# Patient Record
Sex: Male | Born: 1985 | Race: Black or African American | Hispanic: No | Marital: Single | Smoking: Light tobacco smoker
Health system: Southern US, Community
[De-identification: ages and names within clinical notes are randomized; demographics above are authoritative.]

---

## 2012-08-14 ENCOUNTER — Emergency Department (HOSPITAL_COMMUNITY)
Admission: EM | Admit: 2012-08-14 | Discharge: 2012-08-14 | Disposition: A | Discharge status: Home / Self Care | Payer: Self-pay | Attending: Emergency Medicine | Admitting: Emergency Medicine

## 2012-08-14 ENCOUNTER — Encounter (HOSPITAL_COMMUNITY): Payer: Self-pay | Admitting: *Deleted

## 2012-08-14 DIAGNOSIS — Z23 Encounter for immunization: Secondary | ICD-10-CM | POA: Insufficient documentation

## 2012-08-14 DIAGNOSIS — S61219A Laceration without foreign body of unspecified finger without damage to nail, initial encounter: Secondary | ICD-10-CM

## 2012-08-14 DIAGNOSIS — S61209A Unspecified open wound of unspecified finger without damage to nail, initial encounter: Secondary | ICD-10-CM | POA: Insufficient documentation

## 2012-08-14 DIAGNOSIS — Y92838 Other recreation area as the place of occurrence of the external cause: Secondary | ICD-10-CM | POA: Insufficient documentation

## 2012-08-14 DIAGNOSIS — F17200 Nicotine dependence, unspecified, uncomplicated: Secondary | ICD-10-CM | POA: Insufficient documentation

## 2012-08-14 DIAGNOSIS — Y9239 Other specified sports and athletic area as the place of occurrence of the external cause: Secondary | ICD-10-CM | POA: Insufficient documentation

## 2012-08-14 DIAGNOSIS — W230XXA Caught, crushed, jammed, or pinched between moving objects, initial encounter: Secondary | ICD-10-CM | POA: Insufficient documentation

## 2012-08-14 DIAGNOSIS — Y9367 Activity, basketball: Secondary | ICD-10-CM | POA: Insufficient documentation

## 2012-08-14 MED ORDER — TETANUS-DIPHTH-ACELL PERTUSSIS 5-2.5-18.5 LF-MCG/0.5 IM SUSP
0.5000 mL | Freq: Once | INTRAMUSCULAR | Status: AC
  Administered 2012-08-14: 0.5 mL via INTRAMUSCULAR
  Filled 2012-08-14: qty 0.5

## 2012-08-14 NOTE — ED Notes (Signed)
Pt has laceration to anterior rt ring finger app 1"long and middle finger app 1/2" long. Bleeding controlled. Pt states he cut himself on the links of a chain in a basketball net.

## 2012-08-14 NOTE — ED Provider Notes (Signed)
History    CSN: [id number] Arrival date & time 08/14/12  4401  First MD Initiated Contact with Patient 08/14/12 2133     Chief Complaint  Patient presents with  . Extremity Laceration   (Consider location/radiation/quality/duration/timing/severity/associated sxs/prior Treatment) HPI Comments: Was pain.  Basketball, when he jumped up to make a talk and caught his right third and fourth finger.  On the basketball, chain, causing a laceration to the palmar aspect of both fingers no active bleeding at this time.  Last tetanus shot is unknown  The history is provided by the patient.   History reviewed. No pertinent past medical history. History reviewed. No pertinent past surgical history. No family history on file. History  Substance Use Topics  . Smoking status: Light Tobacco Smoker  . Smokeless tobacco: Not on file  . Alcohol Use: Yes    Review of Systems  Constitutional: Negative for fever and chills.  Skin: Positive for wound.  Neurological: Negative for numbness.  All other systems reviewed and are negative.    Allergies  Review of patient's allergies indicates no known allergies.  Home Medications  No current outpatient prescriptions on file. BP 143/88  Pulse 81  Temp(Src) 98 F (36.7 C) (Oral)  Resp 20  SpO2 98% Physical Exam  Nursing note and vitals reviewed. Constitutional: He appears well-developed and well-nourished.  HENT:  Head: Normocephalic.  Eyes: Pupils are equal, round, and reactive to light.  Cardiovascular: Normal rate and regular rhythm.   Pulmonary/Chest: Effort normal and breath sounds normal.  Musculoskeletal: Normal range of motion. He exhibits tenderness.       Hands: Laceration to the palmar surface of the third finger, between the PIP, and PIP joint, approximately 1 cm, superficial Laceration to the palmar surface of the fourth right finger through the DIP joint approximately 4 cm long  Skin: Skin is warm and dry.    ED Course    LACERATION REPAIR Date/Time: 08/14/2012 10:16 PM Performed by: Arman Filter Authorized by: Arman Filter Consent: Verbal consent obtained. Risks and benefits: risks, benefits and alternatives were discussed Consent given by: patient Patient understanding: patient states understanding of the procedure being performed Patient identity confirmed: verbally with patient Body area: upper extremity Location details: right ring finger Laceration length: 4 cm Foreign bodies: metal Tendon involvement: none Nerve involvement: none Vascular damage: no Anesthesia: local infiltration Local anesthetic: lidocaine 1% without epinephrine Anesthetic total: 2 ml Patient sedated: no Irrigation solution: saline Irrigation method: syringe Amount of cleaning: standard Debridement: none Degree of undermining: none Skin closure: 4-0 Prolene Number of sutures: 7 Technique: simple Approximation: close Approximation difficulty: simple Dressing: 4x4 sterile gauze Comments: LACERATION REPAIR Performed by: Arman Filter Authorized by: Arman Filter Consent: Verbal consent obtained. Risks and benefits: risks, benefits and alternatives were discussed Consent given by: patient Patient identity confirmed: provided demographic data Prepped and Draped in normal sterile fashion Wound explored  Laceration Location: Right middle finger  Laceration Length: 1cm  No Foreign Bodies seen or palpated  Anesthesia: local infiltration    Anesthetic total: 0ml  Irrigation method: syringe Amount of cleaning: standard  Skin closure: Steri-Strips  Number of sutures: Steri-Stripped  Technique: Steri-Stripped  Patient tolerance: Patient tolerated the procedure well with no immediate complications.   (including critical care time) Labs Reviewed - No data to display No results found. 1. Finger laceration, initial encounter     MDM  Laceration on fourth digit.  Sutured.  7 stitches, and a total  laceration to to  this long finger Steri-Stripped.  Bandage placed Patient was informed that he should avoid flexing right fourth finger fully for the next 2-3, days.  Keep dressing in place, sutures should be removed in 14 days  Arman Filter, NP 08/14/12 2219

## 2012-08-15 NOTE — ED Provider Notes (Signed)
Medical screening examination/treatment/procedure(s) were performed by non-physician practitioner and as supervising physician I was immediately available for consultation/collaboration.   Dione Booze, MD 08/15/12 [phone]

## 2015-08-17 ENCOUNTER — Emergency Department (HOSPITAL_COMMUNITY): Payer: BLUE CROSS/BLUE SHIELD

## 2015-08-17 ENCOUNTER — Emergency Department (HOSPITAL_COMMUNITY)
Admission: EM | Admit: 2015-08-17 | Discharge: 2015-08-17 | Disposition: A | Discharge status: Home / Self Care | Payer: BLUE CROSS/BLUE SHIELD | Attending: Emergency Medicine | Admitting: Emergency Medicine

## 2015-08-17 ENCOUNTER — Encounter (HOSPITAL_COMMUNITY): Payer: Self-pay

## 2015-08-17 ENCOUNTER — Other Ambulatory Visit: Payer: Self-pay

## 2015-08-17 DIAGNOSIS — F17200 Nicotine dependence, unspecified, uncomplicated: Secondary | ICD-10-CM | POA: Insufficient documentation

## 2015-08-17 DIAGNOSIS — M546 Pain in thoracic spine: Secondary | ICD-10-CM | POA: Diagnosis not present

## 2015-08-17 DIAGNOSIS — M25519 Pain in unspecified shoulder: Secondary | ICD-10-CM | POA: Diagnosis present

## 2015-08-17 MED ORDER — NAPROXEN 375 MG PO TABS: 375.0000 mg | ORAL_TABLET | Freq: Two times a day (BID) | ORAL | Status: AC

## 2015-08-17 MED ORDER — CYCLOBENZAPRINE HCL 10 MG PO TABS: 10.0000 mg | ORAL_TABLET | Freq: Two times a day (BID) | ORAL | Status: AC | PRN

## 2015-08-17 NOTE — ED Provider Notes (Signed)
The patient is a 30 year old male who reports 1 week of pain with swallowing and pain in the right scapular area in the posterior upper back which usually occurs when he eats. It sometimes occurs when he is moving around, he does report that he fell on his shoulder approximately one week ago when he was playing football on that same side but did not have immediate pain. On exam the patient has clear heart and lung sounds, strong pulses, soft abdomen with absolutely no tenderness or guarding in the right upper quadrant or anywhere else. Extremities are normal, I cannot elicit any pain with range of motion or palpation over the areas that he stayed hurt. His chest x-ray is totally normal, he has no fevers chills coughing or any other symptoms of infection or cardiac disease. He has had a slight amount of chest pain while he has been here positioned in the bed awkwardly, I will obtain an EKG prior to discharge, I have given him the warning signs of return and think that his legs he improves with an anti-inflammatory as well as a muscle relaxer that he can probably be discharged and return for workup if those things worsen. I do not think this is acute cholecystitis given his lack of physical exam findings. The patient is in total agreement with the plan.  I saw and evaluated the patient, reviewed the resident's note and I agree with the findings and plan.  I personally interpreted the EKG as well as the resident and agree with the interpretation on the resident's chart.  Final diagnoses:  Right-sided thoracic back pain     EKG Interpretation  Date/Time:  Sunday August 17 2015 20:13:45 EDT Ventricular Rate:  62 PR Interval:    QRS Duration: 85 QT Interval:  370 QTC Calculation: 376 R Axis:   -16 Text Interpretation:  Sinus rhythm Borderline left axis deviation No old tracing to compare Confirmed by Rylee Huestis  MD, Autumn Pruitt ([id number]) on 08/17/2015 8:16:40 PM        Eber HongBrian Elizette Shek, MD 08/19/15 1034

## 2015-08-17 NOTE — ED Notes (Signed)
Patient complains of painful swallowing since Friday and pain to right scapula with same. No nausea, no vomiting, denies trauma

## 2015-08-17 NOTE — Discharge Instructions (Signed)

## 2015-08-17 NOTE — ED Provider Notes (Signed)
CSN: [id number]     Arrival date & time 08/17/15  1545 History   First MD Initiated Contact with Patient 08/17/15 1837     Chief Complaint  Patient presents with  . scapula pain/ pain swallowing      (Consider location/radiation/quality/duration/timing/severity/associated sxs/prior Treatment) HPI Painful swallowing since Friday. Patient states that when he swallows he has pain that is sharp and also double on the right side of his upper back around where his scapula is. He has not had any abdominal pain, no nausea, no anorexia, no vomiting, no diarrhea. He does still have his gallbladder but endorses absolutely no pain at the gallbladder right upper quadrant site. No recent trauma. He has not been lifting anything heavy recently. He denies any fevers or chills, cough, shortness of breath, or chest pain. He denies any cocaine use or any recreational drug use. Denies any syncope or lightheadedness. Denies any weakness or numbness, diaphoresis. He states movement however makes the pain in his back worse. Especially if he moves his right shoulder and arm. He has recently been injured in his shoulder after throwing a football incorrectly but he did not think this was related to the pain as the pain seems to be worse with eating. His throat does not hurt. He has not taken anything for his pain.  History reviewed. No pertinent past medical history. History reviewed. No pertinent past surgical history. No family history on file. Social History  Substance Use Topics  . Smoking status: Light Tobacco Smoker  . Smokeless tobacco: None  . Alcohol Use: Yes    Review of Systems  Constitutional: Negative for fever.  Allergic/Immunologic: Negative for immunocompromised state.  Neurological: Negative for weakness and numbness.  All other systems reviewed and are negative.     Allergies  Review of patient's allergies indicates no known allergies.  Home Medications   Prior to Admission medications    Medication Sig Start Date End Date Taking? Authorizing Provider  acetaminophen (TYLENOL) 500 MG tablet Take 500 mg by mouth every 6 (six) hours as needed for mild pain.   Yes Historical Provider, MD  cyclobenzaprine (FLEXERIL) 10 MG tablet Take 1 tablet (10 mg total) by mouth 2 (two) times daily as needed for muscle spasms. 08/17/15   Sidney AceAlison Charruf Nohea Kras, MD  naproxen (NAPROSYN) 375 MG tablet Take 1 tablet (375 mg total) by mouth 2 (two) times daily. 08/17/15   Sidney AceAlison Charruf Elorah Dewing, MD   BP 134/82 mmHg  Pulse 43  Temp(Src) 98.2 F (36.8 C) (Oral)  Resp 16  Ht 5\' 8"  (1.727 m)  Wt 99.791 kg  BMI 33.46 kg/m2  SpO2 99% Physical Exam  Constitutional: He appears well-developed and well-nourished. No distress.  HENT:  Head: Normocephalic and atraumatic.  Left Ear: External ear normal.  Eyes: Conjunctivae are normal. Pupils are equal, round, and reactive to light. Right eye exhibits no discharge. Left eye exhibits no discharge.  Neck: Normal range of motion. Neck supple.  Cardiovascular: Normal rate and regular rhythm.   No murmur heard. Pulmonary/Chest: Effort normal and breath sounds normal. No respiratory distress.  Abdominal: Soft. Bowel sounds are normal. He exhibits no distension and no mass. There is no tenderness. There is no rebound and no guarding.  Neg murphys sign  Musculoskeletal: He exhibits no edema.  No C-spine, T-spine, L-spine tenderness. Movement of the right upper extremity reproduces pain at the right scapula  Neurological: He is alert.  Skin: Skin is warm. He is not diaphoretic.  Psychiatric: He has  a normal mood and affect.  Nursing note and vitals reviewed.   ED Course  Procedures (including critical care time) Labs Review Labs Reviewed - No data to display  Imaging Review Dg Chest 2 View  08/17/2015  CLINICAL DATA:  Painful swallowing since Friday EXAM: CHEST  2 VIEW COMPARISON:  None. FINDINGS: Normal mediastinum and cardiac silhouette. Normal pulmonary  vasculature. No evidence of effusion, infiltrate, or pneumothorax. No acute bony abnormality. IMPRESSION: No acute cardiopulmonary process. Electronically Signed   By: Genevive BiStewart  Edmunds M.D.   On: 08/17/2015 19:22   I have personally reviewed and evaluated these images and lab results as part of my medical decision-making.   EKG Interpretation   Date/Time:  Sunday August 17 2015 20:13:45 EDT Ventricular Rate:  62 PR Interval:    QRS Duration: 85 QT Interval:  370 QTC Calculation: 376 R Axis:   -16 Text Interpretation:  Sinus rhythm Borderline left axis deviation No old  tracing to compare Confirmed by MILLER  MD, BRIAN ([id number]) on 08/17/2015  8:16:40 PM      MDM   Final diagnoses:  Right-sided thoracic back pain   Doubt cholecystitis as patient has benign abdomen, negative Murphy's, no right upper quadrant pain. Patient didn't subsequently developed some chest pain but EKG unremarkable. No significant risk factors for ACS. No further evaluation indicated. Patient denies any symptoms of cauda equina and is able to ambulate, no weakness no numbness. Patient denies any IV drug use or cocaine use. Chest x-ray without evidence of aortic dissection and this is low risk otherwise. No further evaluation indicated. Patient may have a esophageal spasm-like pathology. Versus muscle strain. Will advise patient to take muscle relaxer and follow-up with PCP. If not improving, may eventually require follow-up with GI. Strict return precautions given. Patient verbalized understanding and agreement with plan and discharged in good condition.    Sidney AceAlison Charruf Rosaleigh Brazzel, MD 08/17/15 [id number]  Eber HongBrian Miller, MD 08/19/15 [id number]

## 2017-07-24 IMAGING — CR DG CHEST 2V
2 series · 2 of 2 positions shown · non-contrast
Comparison: None.

CLINICAL DATA: Painful swallowing since [REDACTED]

EXAM:
CHEST  2 VIEW

[chest pa]
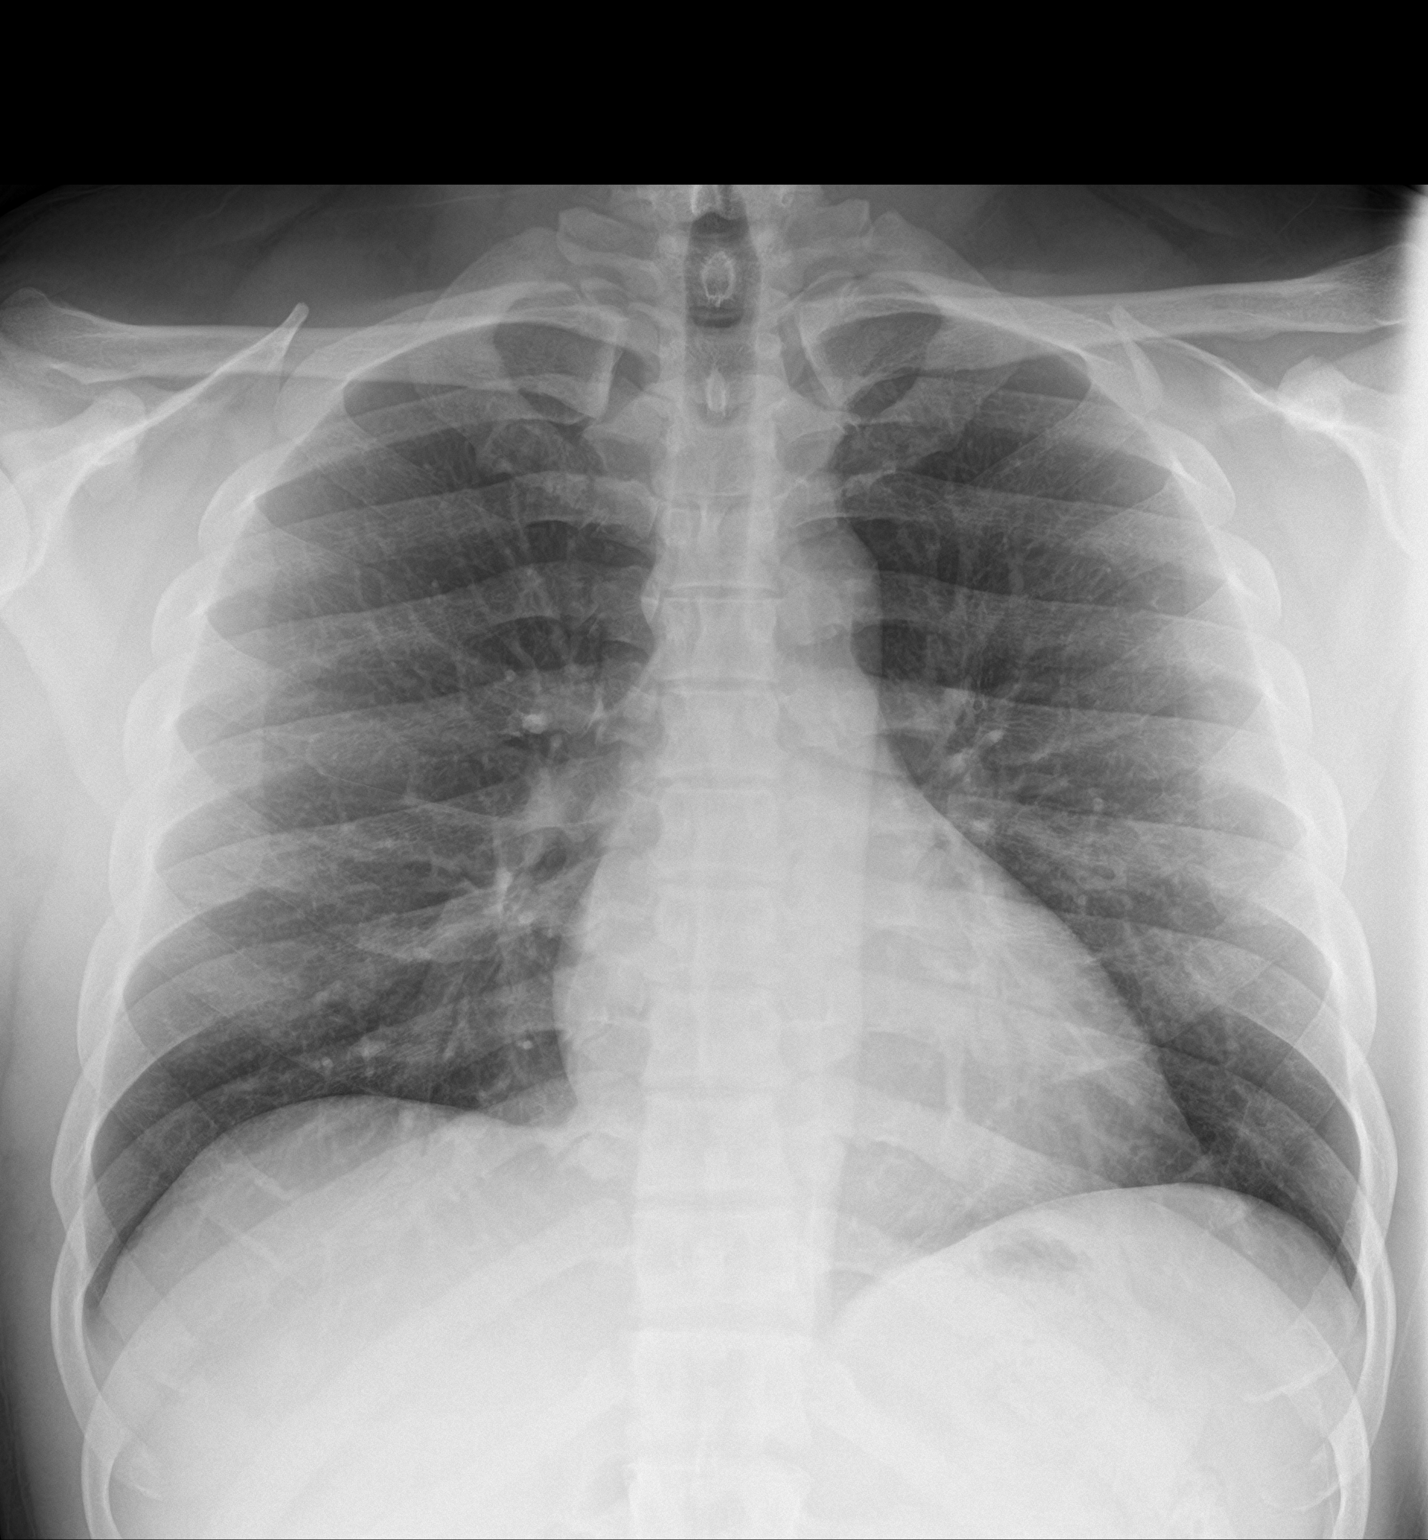

[chest lat]
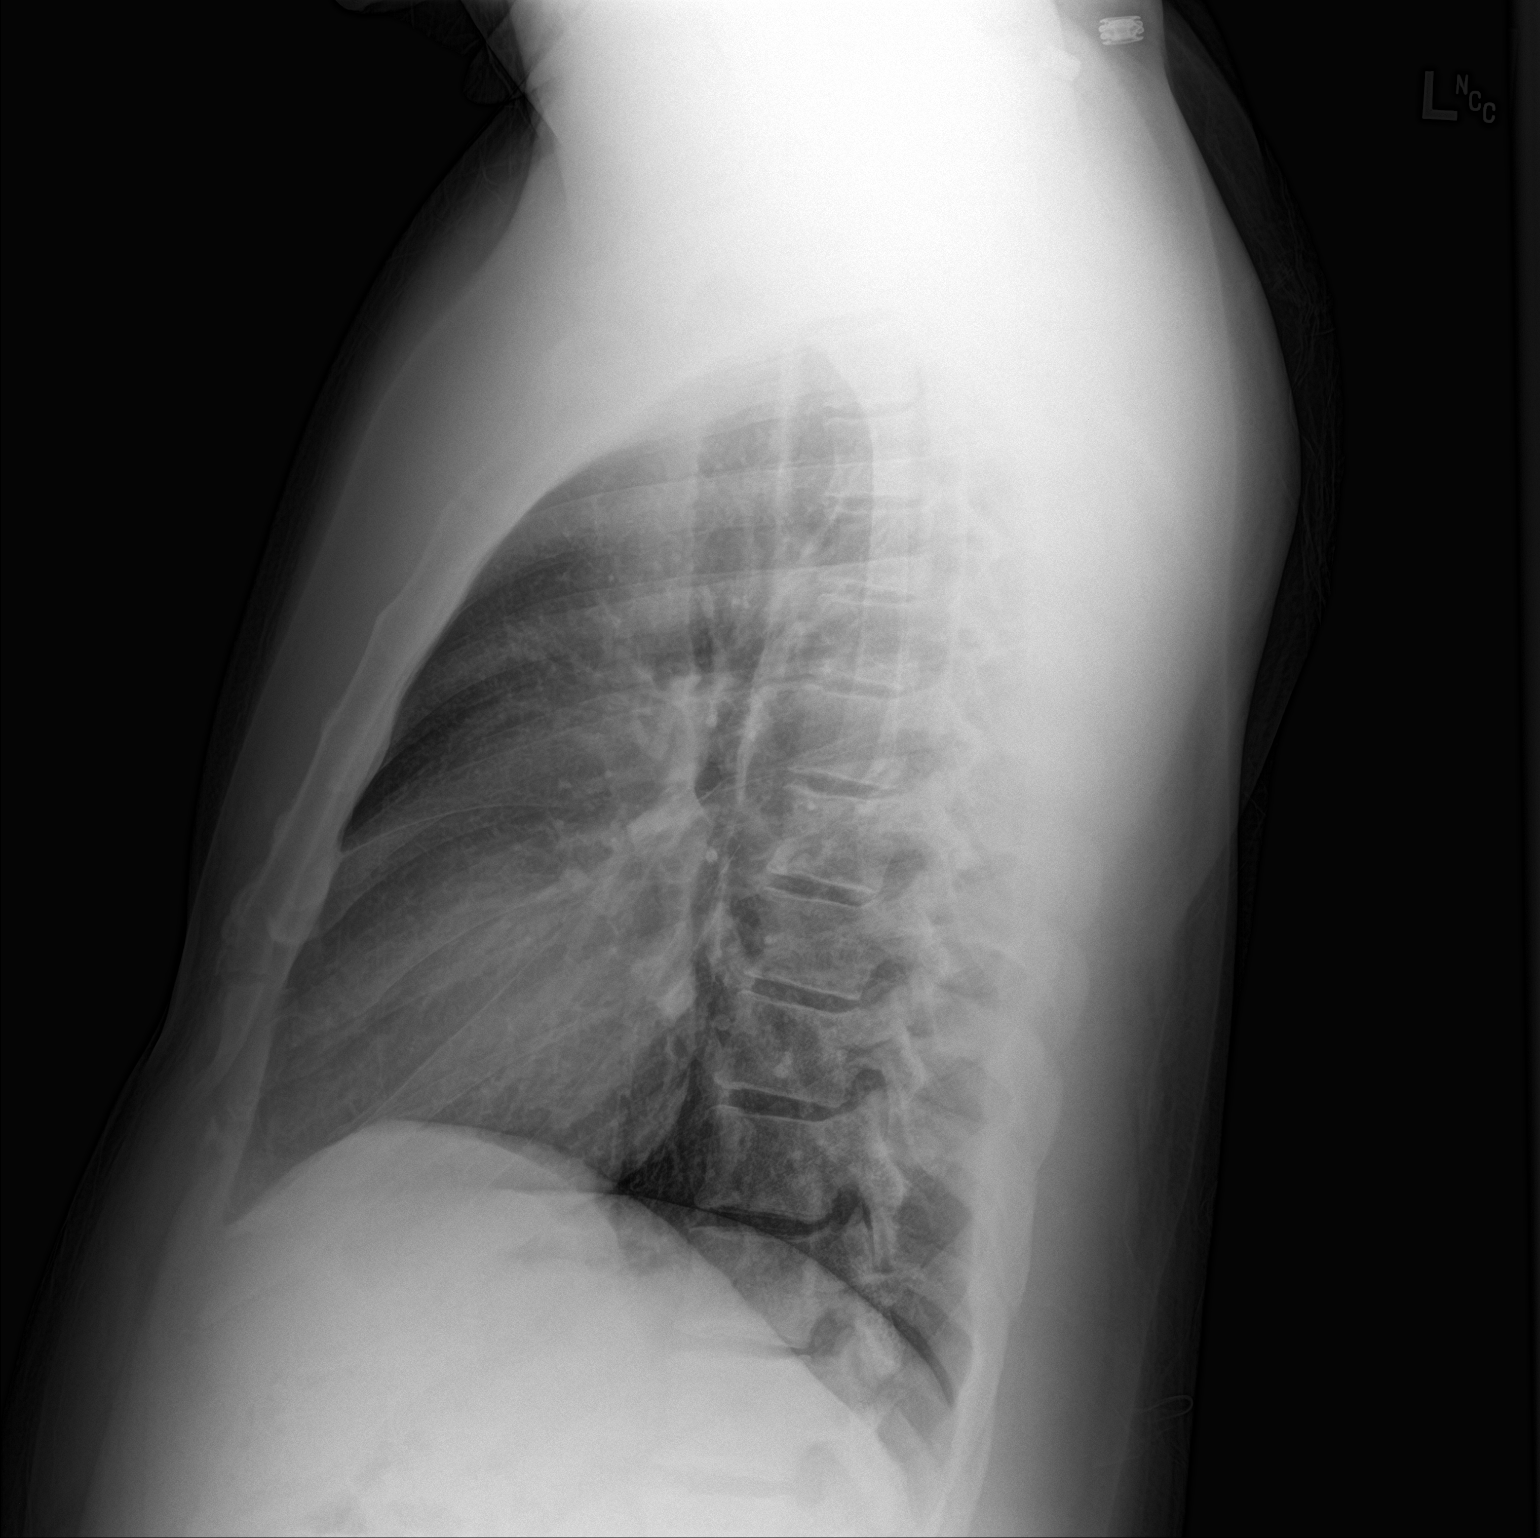

[2 of 2 positions shown; findings below may reference images not displayed]

FINDINGS: Normal mediastinum and cardiac silhouette. Normal pulmonary
vasculature. No evidence of effusion, infiltrate, or pneumothorax.
No acute bony abnormality.
IMPRESSION: No acute cardiopulmonary process.
